# Patient Record
Sex: Male | Born: 2000 | Hispanic: No | Smoking: Never smoker
Health system: Southern US, Community
[De-identification: ages and names within clinical notes are randomized; demographics above are authoritative.]

---
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
## 2022-04-12 DIAGNOSIS — H5213 Myopia, bilateral: Secondary | ICD-10-CM | POA: Diagnosis not present

## 2022-04-12 DIAGNOSIS — H52223 Regular astigmatism, bilateral: Secondary | ICD-10-CM | POA: Diagnosis not present

## 2023-04-17 DIAGNOSIS — Z114 Encounter for screening for human immunodeficiency virus [HIV]: Secondary | ICD-10-CM | POA: Diagnosis not present

## 2023-04-17 DIAGNOSIS — Z111 Encounter for screening for respiratory tuberculosis: Secondary | ICD-10-CM | POA: Diagnosis not present

## 2023-04-17 DIAGNOSIS — Z0000 Encounter for general adult medical examination without abnormal findings: Secondary | ICD-10-CM | POA: Diagnosis not present

## 2023-04-17 DIAGNOSIS — Z6820 Body mass index (BMI) 20.0-20.9, adult: Secondary | ICD-10-CM | POA: Diagnosis not present

## 2023-04-17 DIAGNOSIS — Z23 Encounter for immunization: Secondary | ICD-10-CM | POA: Diagnosis not present

## 2023-04-17 DIAGNOSIS — Z1159 Encounter for screening for other viral diseases: Secondary | ICD-10-CM | POA: Diagnosis not present
